# Patient Record
Sex: Male | Born: 2000
Health system: Southern US, Community
[De-identification: ages and names within clinical notes are randomized; demographics above are authoritative.]

## PROBLEM LIST (undated history)

## (undated) DIAGNOSIS — F419 Anxiety disorder, unspecified: Secondary | ICD-10-CM

---
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
## 2018-11-15 ENCOUNTER — Other Ambulatory Visit: Payer: Self-pay

## 2018-11-15 ENCOUNTER — Emergency Department (HOSPITAL_BASED_OUTPATIENT_CLINIC_OR_DEPARTMENT_OTHER): Payer: BLUE CROSS/BLUE SHIELD

## 2018-11-15 ENCOUNTER — Emergency Department (HOSPITAL_BASED_OUTPATIENT_CLINIC_OR_DEPARTMENT_OTHER)
Admission: EM | Admit: 2018-11-15 | Discharge: 2018-11-15 | Disposition: A | Discharge status: Home / Self Care | Payer: BLUE CROSS/BLUE SHIELD | Attending: Emergency Medicine | Admitting: Emergency Medicine

## 2018-11-15 ENCOUNTER — Encounter (HOSPITAL_BASED_OUTPATIENT_CLINIC_OR_DEPARTMENT_OTHER): Payer: Self-pay | Admitting: Emergency Medicine

## 2018-11-15 DIAGNOSIS — R079 Chest pain, unspecified: Secondary | ICD-10-CM | POA: Diagnosis present

## 2018-11-15 DIAGNOSIS — Z87891 Personal history of nicotine dependence: Secondary | ICD-10-CM | POA: Diagnosis not present

## 2018-11-15 DIAGNOSIS — F419 Anxiety disorder, unspecified: Secondary | ICD-10-CM

## 2018-11-15 DIAGNOSIS — R0789 Other chest pain: Secondary | ICD-10-CM | POA: Diagnosis not present

## 2018-11-15 DIAGNOSIS — R200 Anesthesia of skin: Secondary | ICD-10-CM | POA: Diagnosis not present

## 2018-11-15 LAB — CBC WITH DIFFERENTIAL/PLATELET
Abs Immature Granulocytes: 0.02 10*3/uL (ref 0.00–0.07)
Basophils Absolute: 0 10*3/uL (ref 0.0–0.1)
Basophils Relative: 1 %
Eosinophils Absolute: 0 10*3/uL (ref 0.0–0.5)
Eosinophils Relative: 1 %
HCT: 49.8 % (ref 39.0–52.0)
Hemoglobin: 16.2 g/dL (ref 13.0–17.0)
Immature Granulocytes: 0 %
Lymphocytes Relative: 36 %
Lymphs Abs: 2.3 10*3/uL (ref 0.7–4.0)
MCH: 29.2 pg (ref 26.0–34.0)
MCHC: 32.5 g/dL (ref 30.0–36.0)
MCV: 89.7 fL (ref 80.0–100.0)
MONOS PCT: 8 %
Monocytes Absolute: 0.5 10*3/uL (ref 0.1–1.0)
NEUTROS PCT: 54 %
Neutro Abs: 3.5 10*3/uL (ref 1.7–7.7)
Platelets: 265 10*3/uL (ref 150–400)
RBC: 5.55 MIL/uL (ref 4.22–5.81)
RDW: 11.5 % (ref 11.5–15.5)
WBC: 6.4 10*3/uL (ref 4.0–10.5)
nRBC: 0 % (ref 0.0–0.2)

## 2018-11-15 LAB — COMPREHENSIVE METABOLIC PANEL
ALT: 24 U/L (ref 0–44)
AST: 20 U/L (ref 15–41)
Albumin: 4.7 g/dL (ref 3.5–5.0)
Alkaline Phosphatase: 55 U/L (ref 38–126)
Anion gap: 7 (ref 5–15)
BUN: 11 mg/dL (ref 6–20)
CO2: 26 mmol/L (ref 22–32)
Calcium: 9.3 mg/dL (ref 8.9–10.3)
Chloride: 104 mmol/L (ref 98–111)
Creatinine, Ser: 0.72 mg/dL (ref 0.61–1.24)
GFR calc Af Amer: >60 mL/min (ref >60)
Glucose, Bld: 90 mg/dL (ref 70–99)
Potassium: 3.5 mmol/L (ref 3.5–5.1)
Sodium: 137 mmol/L (ref 135–145)
Total Bilirubin: 0.7 mg/dL (ref 0.3–1.2)
Total Protein: 7.9 g/dL (ref 6.5–8.1)

## 2018-11-15 LAB — CBC
HCT: 49.2 % (ref 39.0–52.0)
Hemoglobin: 16.2 g/dL (ref 13.0–17.0)
MCH: 29.5 pg (ref 26.0–34.0)
MCHC: 32.9 g/dL (ref 30.0–36.0)
MCV: 89.6 fL (ref 80.0–100.0)
Platelets: 260 10*3/uL (ref 150–400)
RBC: 5.49 MIL/uL (ref 4.22–5.81)
RDW: 11.4 % — ABNORMAL LOW (ref 11.5–15.5)
WBC: 6.7 10*3/uL (ref 4.0–10.5)
nRBC: 0 % (ref 0.0–0.2)

## 2018-11-15 LAB — LIPASE, BLOOD: Lipase: 31 U/L (ref 11–51)

## 2018-11-15 LAB — TROPONIN I: Troponin I: <0.03 ng/mL (ref <0.03)

## 2018-11-15 MED ORDER — HYDROXYZINE HCL 25 MG PO TABS: 25.0000 mg | ORAL_TABLET | Freq: Three times a day (TID) | ORAL | 0 refills | Status: AC | PRN

## 2018-11-15 MED FILL — hydrOXYzine HCL 25 MG TABS: 25 | 6 days supply | Qty: 20 | Fill #0

## 2018-11-15 NOTE — ED Notes (Signed)
ED Provider at bedside. 

## 2018-11-15 NOTE — ED Provider Notes (Signed)
[facility] EMERGENCY DEPARTMENT Provider Note   CSN: [id number] Arrival date & time: 11/15/18  1052     History   Chief Complaint Chief Complaint  Patient presents with  . Chest Pain    HPI Travis Reilly is a 18 y.o. male here presenting with chest pain, anxiety, left arm numbness.  Patient states that he has intermittent left arm numbness for the last month or so.  Also has some chest pain as well.  Patient went to see [facility] about a month ago and had normal neuro exam as well as normal labs.  No CT head was performed at that time.  For the last 2 weeks, patient has been more stressed out.  He has intermittent left-sided chest pain and he notices numbness is worse.  Denies any weakness or trouble walking.   The history is provided by the patient.    History reviewed. No pertinent past medical history.  There are no active problems to display for this patient.   History reviewed. No pertinent surgical history.      Home Medications    Prior to Admission medications   Medication Sig Start Date End Date Taking? Authorizing Provider  hydrOXYzine (ATARAX/VISTARIL) 25 MG tablet Take 25 mg by mouth 3 (three) times daily as needed.   Yes [provider]    Family History History reviewed. No pertinent family history.  Social History Social History   Tobacco Use  . Smoking status: Former Games developermoker  . Smokeless tobacco: Never Used  Substance Use Topics  . Alcohol use: Never    Frequency: Never  . Drug use: Never     Allergies   Penicillins   Review of Systems Review of Systems  Cardiovascular: Positive for chest pain.  Neurological: Positive for numbness.  All other systems reviewed and are negative.    Physical Exam Updated Vital Signs BP 128/79 (BP Location: Right Arm)   Pulse 66   Temp 98.3 F (36.8 C) (Oral)   Resp 12   Ht 6\' 1"  (1.854 m)   Wt 122.9 kg   SpO2 100%   BMI 35.75 kg/m   Physical Exam Vitals signs and nursing  note reviewed.  Constitutional:      Appearance: He is well-developed.  HENT:     Head: Normocephalic.  Eyes:     Extraocular Movements: Extraocular movements intact.     Pupils: Pupils are equal, round, and reactive to light.  Neck:     Musculoskeletal: Normal range of motion and neck supple.  Cardiovascular:     Rate and Rhythm: Normal rate and regular rhythm.     Heart sounds: Normal heart sounds.  Pulmonary:     Effort: Pulmonary effort is normal.     Breath sounds: Normal breath sounds.  Abdominal:     General: Bowel sounds are normal.     Palpations: Abdomen is soft.  Musculoskeletal: Normal range of motion.  Skin:    General: Skin is warm.     Capillary Refill: Capillary refill takes less than 2 seconds.  Neurological:     General: No focal deficit present.     Mental Status: He is alert and oriented to person, place, and time.     Cranial Nerves: No cranial nerve deficit.     Motor: No weakness.     Comments: CN 2- 12 intact, nl finger to nose, nl sensation and strength throughout, nl gait   Psychiatric:        Mood and  Affect: Mood normal.        Behavior: Behavior normal.      ED Treatments / Results  Labs (all labs ordered are listed, but only abnormal results are displayed) Labs Reviewed  CBC - Abnormal; Notable for the following components:      Result Value   RDW 11.4 (*)    All other components within normal limits  TROPONIN I  CBC WITH DIFFERENTIAL/PLATELET  COMPREHENSIVE METABOLIC PANEL  LIPASE, BLOOD    EKG None  Radiology Dg Chest 2 View  Result Date: 11/15/2018 CLINICAL DATA:  Left-sided chest pain. EXAM: CHEST - 2 VIEW COMPARISON:  No prior. FINDINGS: Mediastinum and hilar structures normal. Lungs are clear. No pleural effusion or pneumothorax. Degenerative change thoracic spine. IMPRESSION: Acute cardiopulmonary disease. Electronically Signed   By: Maisie Fus  Register   On: 11/15/2018 11:34   Ct Head Wo Contrast  Result Date:  11/15/2018 CLINICAL DATA:  18 year old male with history of intermittent left-sided chest pain for the past 2 weeks that began while having an anxiety attack. EXAM: CT HEAD WITHOUT CONTRAST TECHNIQUE: Contiguous axial images were obtained from the base of the skull through the vertex without intravenous contrast. COMPARISON:  None. FINDINGS: Brain: No evidence of acute infarction, hemorrhage, hydrocephalus, extra-axial collection or mass lesion/mass effect. Vascular: No hyperdense vessel or unexpected calcification. Skull: Normal. Negative for fracture or focal lesion. Sinuses/Orbits: No acute finding. Other: None. IMPRESSION: 1. No acute intracranial abnormalities. The appearance of the brain is normal. Electronically Signed   By: Trudie Reed M.D.   On: 11/15/2018 12:34    Procedures Procedures (including critical care time)  Medications Ordered in ED Medications - No data to display   Initial Impression / Assessment and Plan / ED Course  I have reviewed the triage vital signs and the nursing notes.  Pertinent labs & imaging results that were available during my care of the patient were reviewed by me and considered in my medical decision making (see chart for details).    Travis Reilly is a 18 y.o. male here with chest pain, L sided numbness. No numbness or weakness on my exam. Chest pain for 2 weeks and low risk for ACS. I doubt dissection or PE. Will get labs, CT head to r/o mass, trop x 1.   1:14 PM Labs unremarkable. CT head unremarkable. Stable for discharge. Likely panic attacks or anxiety.     Final Clinical Impressions(s) / ED Diagnoses   Final diagnoses:  None    ED Discharge Orders    None       Charlynne Pander, MD 11/15/18 1314

## 2018-11-15 NOTE — Discharge Instructions (Signed)
See your doctor as scheduled in 2 weeks   Take hydroxyzine as needed for anxiety  See your doctor. Ask your doctor about long term medicine for anxiety   Return to ER if you have worse chest pain, numbness, weakness

## 2018-11-15 NOTE — ED Triage Notes (Addendum)
Reports intermittent chest pain x 2 weeks.  Denies shortness of breath.  States recent history of anxiety.  Reports this worsens when eating.

## 2021-04-14 ENCOUNTER — Emergency Department (HOSPITAL_BASED_OUTPATIENT_CLINIC_OR_DEPARTMENT_OTHER)
Admission: EM | Admit: 2021-04-14 | Discharge: 2021-04-14 | Disposition: A | Discharge status: Home / Self Care | Payer: BC Managed Care – PPO | Attending: Emergency Medicine | Admitting: Emergency Medicine

## 2021-04-14 ENCOUNTER — Other Ambulatory Visit: Payer: Self-pay

## 2021-04-14 ENCOUNTER — Emergency Department (HOSPITAL_BASED_OUTPATIENT_CLINIC_OR_DEPARTMENT_OTHER): Payer: BC Managed Care – PPO

## 2021-04-14 ENCOUNTER — Encounter (HOSPITAL_BASED_OUTPATIENT_CLINIC_OR_DEPARTMENT_OTHER): Payer: Self-pay | Admitting: Emergency Medicine

## 2021-04-14 DIAGNOSIS — S93401A Sprain of unspecified ligament of right ankle, initial encounter: Secondary | ICD-10-CM

## 2021-04-14 DIAGNOSIS — F17210 Nicotine dependence, cigarettes, uncomplicated: Secondary | ICD-10-CM | POA: Insufficient documentation

## 2021-04-14 DIAGNOSIS — W500XXA Accidental hit or strike by another person, initial encounter: Secondary | ICD-10-CM | POA: Insufficient documentation

## 2021-04-14 DIAGNOSIS — Y9375 Activity, martial arts: Secondary | ICD-10-CM | POA: Insufficient documentation

## 2021-04-14 DIAGNOSIS — S99911A Unspecified injury of right ankle, initial encounter: Secondary | ICD-10-CM | POA: Diagnosis present
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
## 2021-04-30 ENCOUNTER — Encounter: Payer: Self-pay | Admitting: Family Medicine

## 2021-04-30 ENCOUNTER — Other Ambulatory Visit: Payer: Self-pay

## 2021-04-30 ENCOUNTER — Ambulatory Visit (INDEPENDENT_AMBULATORY_CARE_PROVIDER_SITE_OTHER): Payer: BC Managed Care – PPO | Admitting: Family Medicine

## 2021-04-30 ENCOUNTER — Ambulatory Visit: Payer: Self-pay

## 2021-04-30 ENCOUNTER — Ambulatory Visit (HOSPITAL_BASED_OUTPATIENT_CLINIC_OR_DEPARTMENT_OTHER)
Admission: RE | Admit: 2021-04-30 | Discharge: 2021-04-30 | Disposition: A | Discharge status: Home / Self Care | Payer: BC Managed Care – PPO | Source: Ambulatory Visit | Attending: Family Medicine | Admitting: Family Medicine

## 2021-04-30 VITALS — BP 120/74 | Ht 73.0 in | Wt 243.0 lb

## 2021-04-30 DIAGNOSIS — M6289 Other specified disorders of muscle: Secondary | ICD-10-CM | POA: Diagnosis not present

## 2021-04-30 DIAGNOSIS — G8929 Other chronic pain: Secondary | ICD-10-CM | POA: Diagnosis not present

## 2021-04-30 DIAGNOSIS — M5441 Lumbago with sciatica, right side: Secondary | ICD-10-CM

## 2021-04-30 DIAGNOSIS — M25571 Pain in right ankle and joints of right foot: Secondary | ICD-10-CM | POA: Diagnosis not present

## 2021-04-30 DIAGNOSIS — M5442 Lumbago with sciatica, left side: Secondary | ICD-10-CM | POA: Insufficient documentation

## 2021-04-30 NOTE — Patient Instructions (Signed)
Nice to meet you  Please try the exercises  Please consider the compression for the ankle  I will call with the results.  Please send me a message in MyChart with any questions or updates.  Please see me back in 4 weeks.   --Dr. Jordan Likes
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
## 2021-04-30 NOTE — Assessment & Plan Note (Signed)
Symptoms appear most consistent with a healing herniation of the fascia. No significant defect. Appears to have a healing hematoma.  - counseled on home exercise therapy and supportive care - counseled on compression.
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
## 2021-05-01 ENCOUNTER — Telehealth: Payer: Self-pay | Admitting: Family Medicine

## 2021-05-01 NOTE — Telephone Encounter (Signed)
Unable to leave VM for patient. If he calls back please have him speak with a nurse/CMA and inform that his xray is normal. We can try physical therapy going forward.   If any questions then please take the best time and phone number to call and I will try to call him back.   Myra Rude, MD [facility] 05/01/2021, 2:09 PM

## 2021-05-02 NOTE — Telephone Encounter (Signed)
Pt informed of below.  

## 2021-05-28 ENCOUNTER — Ambulatory Visit: Payer: BC Managed Care – PPO | Admitting: Family Medicine

## 2021-05-28 NOTE — Progress Notes (Deleted)
  Travis Reilly - 20 y.o. male MRN [id number]  Date of birth: 23-Apr-2001  SUBJECTIVE:  Including CC & ROS.  No chief complaint on file.   Travis Reilly is a 20 y.o. male that is  ***.  ***   Review of Systems See HPI   HISTORY: Past Medical, Surgical, Social, and Family History Reviewed & Updated per EMR.   Pertinent Historical Findings include:  No past medical history on file.  No past surgical history on file.  No family history on file.  Social History   Socioeconomic History   Marital status: Single    Spouse name: Not on file   Number of children: Not on file   Years of education: Not on file   Highest education level: Not on file  Occupational History   Not on file  Tobacco Use   Smoking status: Every Day    Types: Cigarettes   Smokeless tobacco: Never  Vaping Use   Vaping Use: Every day  Substance and Sexual Activity   Alcohol use: Yes    Comment: occ   Drug use: Yes    Types: Marijuana   Sexual activity: Not on file  Other Topics Concern   Not on file  Social History Narrative   Not on file   Social Determinants of Health   Financial Resource Strain: Not on file  Food Insecurity: Not on file  Transportation Needs: Not on file  Physical Activity: Not on file  Stress: Not on file  Social Connections: Not on file  Intimate Partner Violence: Not on file     PHYSICAL EXAM:  VS: There were no vitals taken for this visit. Physical Exam Gen: NAD, alert, cooperative with exam, well-appearing MSK:  ***      ASSESSMENT & PLAN:   No problem-specific Assessment & Plan notes found for this encounter.

## 2021-11-08 ENCOUNTER — Encounter (HOSPITAL_BASED_OUTPATIENT_CLINIC_OR_DEPARTMENT_OTHER): Payer: Self-pay | Admitting: *Deleted

## 2021-11-08 ENCOUNTER — Other Ambulatory Visit: Payer: Self-pay

## 2021-11-08 ENCOUNTER — Emergency Department (HOSPITAL_BASED_OUTPATIENT_CLINIC_OR_DEPARTMENT_OTHER)
Admission: EM | Admit: 2021-11-08 | Discharge: 2021-11-09 | Disposition: A | Discharge status: Home / Self Care | Payer: BC Managed Care – PPO | Attending: Emergency Medicine | Admitting: Emergency Medicine

## 2021-11-08 DIAGNOSIS — Z87891 Personal history of nicotine dependence: Secondary | ICD-10-CM | POA: Insufficient documentation

## 2021-11-08 DIAGNOSIS — M5481 Occipital neuralgia: Secondary | ICD-10-CM | POA: Insufficient documentation

## 2021-11-08 DIAGNOSIS — M25512 Pain in left shoulder: Secondary | ICD-10-CM | POA: Diagnosis not present

## 2021-11-08 DIAGNOSIS — R519 Headache, unspecified: Secondary | ICD-10-CM | POA: Diagnosis present

## 2021-11-08 HISTORY — DX: Anxiety disorder, unspecified: F41.9
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
## 2021-11-09 MED ORDER — GABAPENTIN 300 MG PO CAPS: ORAL_CAPSULE | ORAL | 0 refills | Status: AC
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
## 2022-12-04 ENCOUNTER — Encounter (HOSPITAL_BASED_OUTPATIENT_CLINIC_OR_DEPARTMENT_OTHER): Payer: Self-pay

## 2022-12-04 ENCOUNTER — Other Ambulatory Visit: Payer: Self-pay

## 2022-12-04 ENCOUNTER — Emergency Department (HOSPITAL_BASED_OUTPATIENT_CLINIC_OR_DEPARTMENT_OTHER): Payer: BC Managed Care – PPO

## 2022-12-04 DIAGNOSIS — B2790 Infectious mononucleosis, unspecified without complication: Secondary | ICD-10-CM | POA: Diagnosis not present

## 2022-12-04 DIAGNOSIS — Z20822 Contact with and (suspected) exposure to covid-19: Secondary | ICD-10-CM | POA: Diagnosis not present

## 2022-12-04 DIAGNOSIS — R509 Fever, unspecified: Secondary | ICD-10-CM | POA: Diagnosis present

## 2022-12-04 LAB — CBC WITH DIFFERENTIAL/PLATELET
Abs Immature Granulocytes: 0.04 10*3/uL (ref 0.00–0.07)
Basophils Absolute: 0.2 10*3/uL — ABNORMAL HIGH (ref 0.0–0.1)
Basophils Relative: 2 %
Eosinophils Absolute: 0 10*3/uL (ref 0.0–0.5)
Eosinophils Relative: 0 %
HCT: 45.4 % (ref 39.0–52.0)
Hemoglobin: 15.8 g/dL (ref 13.0–17.0)
Immature Granulocytes: 0 %
Lymphocytes Relative: 75 %
Lymphs Abs: 8 10*3/uL — ABNORMAL HIGH (ref 0.7–4.0)
MCH: 31.1 pg (ref 26.0–34.0)
MCHC: 34.8 g/dL (ref 30.0–36.0)
MCV: 89.4 fL (ref 80.0–100.0)
Monocytes Absolute: 0.5 10*3/uL (ref 0.1–1.0)
Monocytes Relative: 5 %
Neutro Abs: 1.9 10*3/uL (ref 1.7–7.7)
Neutrophils Relative %: 18 %
Platelets: 143 10*3/uL — ABNORMAL LOW (ref 150–400)
RBC: 5.08 MIL/uL (ref 4.22–5.81)
RDW: 12 % (ref 11.5–15.5)
Smear Review: NORMAL
WBC: 10.7 10*3/uL — ABNORMAL HIGH (ref 4.0–10.5)
nRBC: 0 % (ref 0.0–0.2)

## 2022-12-04 LAB — COMPREHENSIVE METABOLIC PANEL
ALT: 372 U/L — ABNORMAL HIGH (ref 0–44)
AST: 143 U/L — ABNORMAL HIGH (ref 15–41)
Albumin: 4 g/dL (ref 3.5–5.0)
Alkaline Phosphatase: 319 U/L — ABNORMAL HIGH (ref 38–126)
Anion gap: 7 (ref 5–15)
BUN: 9 mg/dL (ref 6–20)
CO2: 24 mmol/L (ref 22–32)
Calcium: 8.6 mg/dL — ABNORMAL LOW (ref 8.9–10.3)
Chloride: 101 mmol/L (ref 98–111)
Creatinine, Ser: 0.87 mg/dL (ref 0.61–1.24)
GFR, Estimated: >60 mL/min (ref >60)
Glucose, Bld: 98 mg/dL (ref 70–99)
Potassium: 3.6 mmol/L (ref 3.5–5.1)
Sodium: 132 mmol/L — ABNORMAL LOW (ref 135–145)
Total Bilirubin: 3.5 mg/dL — ABNORMAL HIGH (ref 0.3–1.2)
Total Protein: 7.7 g/dL (ref 6.5–8.1)

## 2022-12-04 LAB — URINALYSIS, ROUTINE W REFLEX MICROSCOPIC
Bilirubin Urine: NEGATIVE
Glucose, UA: NEGATIVE mg/dL
Hgb urine dipstick: NEGATIVE
Ketones, ur: NEGATIVE mg/dL
Leukocytes,Ua: NEGATIVE
Nitrite: NEGATIVE
Protein, ur: NEGATIVE mg/dL
Specific Gravity, Urine: 1.01 (ref 1.005–1.030)
pH: 7 (ref 5.0–8.0)

## 2022-12-04 LAB — RESP PANEL BY RT-PCR (RSV, FLU A&B, COVID)  RVPGX2
Influenza A by PCR: NEGATIVE
Influenza B by PCR: NEGATIVE
Resp Syncytial Virus by PCR: NEGATIVE
SARS Coronavirus 2 by RT PCR: NEGATIVE

## 2022-12-04 NOTE — ED Triage Notes (Signed)
Pt states he "got fed up w a situation that's been going on for too long." States he was "dealing w respiratory illness & 'just took some abx I had for that, it got a little better.'"  Woke up w HA 4 days straight, quit smoking cigars & it "kinda" resolved, "weird feeling shooting through body," lymph node on L side of neck swollen, urinating "[employer]-colored urine." States all symptoms are worse at night. Endorses Poor PO

## 2022-12-05 ENCOUNTER — Encounter (HOSPITAL_BASED_OUTPATIENT_CLINIC_OR_DEPARTMENT_OTHER): Payer: Self-pay

## 2022-12-05 ENCOUNTER — Emergency Department (HOSPITAL_BASED_OUTPATIENT_CLINIC_OR_DEPARTMENT_OTHER)
Admission: EM | Admit: 2022-12-05 | Discharge: 2022-12-05 | Disposition: A | Discharge status: Home / Self Care | Payer: BC Managed Care – PPO | Attending: Emergency Medicine | Admitting: Emergency Medicine

## 2022-12-05 ENCOUNTER — Emergency Department (HOSPITAL_BASED_OUTPATIENT_CLINIC_OR_DEPARTMENT_OTHER): Payer: BC Managed Care – PPO

## 2022-12-05 DIAGNOSIS — B2790 Infectious mononucleosis, unspecified without complication: Secondary | ICD-10-CM

## 2022-12-05 LAB — MONONUCLEOSIS SCREEN: Mono Screen: POSITIVE — AB

## 2022-12-05 LAB — HEPATITIS PANEL, ACUTE
HCV Ab: NONREACTIVE
Hep A IgM: NONREACTIVE
Hep B C IgM: NONREACTIVE
Hepatitis B Surface Ag: NONREACTIVE

## 2022-12-05 LAB — LIPASE, BLOOD: Lipase: 35 U/L (ref 11–51)

## 2022-12-05 LAB — GROUP A STREP BY PCR: Group A Strep by PCR: NOT DETECTED

## 2022-12-05 MED ORDER — IOHEXOL 300 MG/ML  SOLN
100.0000 mL | Freq: Once | INTRAMUSCULAR | Status: AC | PRN
  Administered 2022-12-05: 100 mL via INTRAVENOUS

## 2022-12-05 NOTE — ED Notes (Signed)
Pt ambulatory with steady gait to room from lobby.

## 2022-12-05 NOTE — ED Provider Notes (Signed)
[facility]  Provider Note  CSN: [id number] Arrival date & time: 12/04/22 2154  History Chief Complaint  Patient presents with   multiple complaints    Travis Reilly is a 22 y.o. male with no significant PMH reports about 2 weeks ago he had a URI with sore throat, fever and body aches. Since then he has had resolution of his sore throat, but now having continued general malaise, generalized abdominal discomfort, nausea, vomiting and dark colored urine. He noted an enlarged lymph node on left neck. No further fevers. No diarrhea, but grandfather with the patient has had a recent recurrent diarrheal illness.    Home Medications Prior to Admission medications   Medication Sig Start Date End Date Taking? Authorizing Provider  gabapentin (NEURONTIN) 300 MG capsule Take 1 tablet the first day, take 1 tablet twice daily the second day, then take 1 tablet 3 times a day. 11/09/21   Molpus, John, MD  hydrOXYzine (ATARAX/VISTARIL) 25 MG tablet Take 1 tablet (25 mg total) by mouth 3 (three) times daily as needed. 11/15/18   Drenda Freeze, MD     Allergies    Penicillins   Review of Systems   Review of Systems Please see HPI for pertinent positives and negatives  Physical Exam BP 118/82 (BP Location: Right Arm)   Pulse 95   Temp 98.5 F (36.9 C) (Oral)   Resp 17   SpO2 99%   Physical Exam Vitals and nursing note reviewed.  Constitutional:      Appearance: Normal appearance.  HENT:     Head: Normocephalic and atraumatic.     Nose: Nose normal.     Mouth/Throat:     Mouth: Mucous membranes are moist.     Pharynx: No oropharyngeal exudate or posterior oropharyngeal erythema.  Eyes:     Extraocular Movements: Extraocular movements intact.     Conjunctiva/sclera: Conjunctivae normal.  Cardiovascular:     Rate and Rhythm: Normal rate.  Pulmonary:     Effort: Pulmonary effort is normal.     Breath sounds: Normal breath sounds.   Abdominal:     General: Abdomen is flat.     Palpations: Abdomen is soft.     Tenderness: There is abdominal tenderness (mild, RUQ). There is no guarding.  Musculoskeletal:        General: No swelling. Normal range of motion.     Cervical back: Neck supple.  Lymphadenopathy:     Cervical: Cervical adenopathy (mildly enlarged L anterior node) present.  Skin:    General: Skin is warm and dry.  Neurological:     General: No focal deficit present.     Mental Status: He is alert.  Psychiatric:        Mood and Affect: Mood normal.     ED Results / Procedures / Treatments   EKG None  Procedures Procedures  Medications Ordered in the ED Medications  iohexol (OMNIPAQUE) 300 MG/ML solution 100 mL (100 mLs Intravenous Contrast Given 12/05/22 0212)    Initial Impression and Plan  Patient here with an unusual constellation of symptoms that seems to have started with a viral illness last week. Labs done in triage show CBC with smudge cells/atypical lymphs likely reactive, does not appear malignant per lab tech. CMP with elevated liver enzymes and bilirubin which may account for his dark urine, but no clear etiology. No recent labs to compare. Will add lipase, hepatitis panel, strep and mono tests. UA is  clear. I personally viewed the images from radiology studies and agree with radiologist interpretation: CXR is normal. Send for CT to evaluate hepatobiliary system.   ED Course   Clinical Course as of 12/05/22 0259  Fri Dec 05, 2022  0241 Lipase is normal.  [CS]  0256 I personally viewed the images from radiology studies and agree with radiologist interpretation: CT shows splenomegaly which is also consistent with his positive mono test. This also explains his abnormal labs and his sorethroat/lymphadenopathy.  Patient advised he may still feel poorly for another week or two. Recommend he rest, stay hydrated and avoid any contact sports/injuries to spleen. PCP follow up to recheck labs. RTED  for any other concerns.  [CS]    Clinical Course User Index [CS] Truddie Hidden, MD     MDM Rules/Calculators/A&P Medical Decision Making Given presenting complaint, I considered that admission might be necessary. After review of results from ED lab and/or imaging studies, admission to the hospital is not indicated at this time.    Problems Addressed: Infectious mononucleosis without complication, infectious mononucleosis due to unspecified organism: acute illness or injury  Amount and/or Complexity of Data Reviewed Labs: ordered. Decision-making details documented in ED Course. Radiology: ordered and independent interpretation performed. Decision-making details documented in ED Course.  Risk Prescription drug management. Decision regarding hospitalization.     Final Clinical Impression(s) / ED Diagnoses Final diagnoses:  Infectious mononucleosis without complication, infectious mononucleosis due to unspecified organism    Rx / DC Orders ED Discharge Orders     None        Truddie Hidden, MD 12/05/22 [phone]

## 2022-12-10 LAB — PATHOLOGIST SMEAR REVIEW: Path Review: REACTIVE

## 2023-02-16 ENCOUNTER — Encounter: Payer: Self-pay | Admitting: *Deleted

## 2023-03-19 IMAGING — DX DG ANKLE COMPLETE 3+V*R*
3 series · 3 of 3 positions shown · non-contrast
Comparison: None.

CLINICAL DATA: Injured right ankle doing martial arts.

EXAM:
RIGHT ANKLE - COMPLETE 3+ VIEW

[ankle ap]
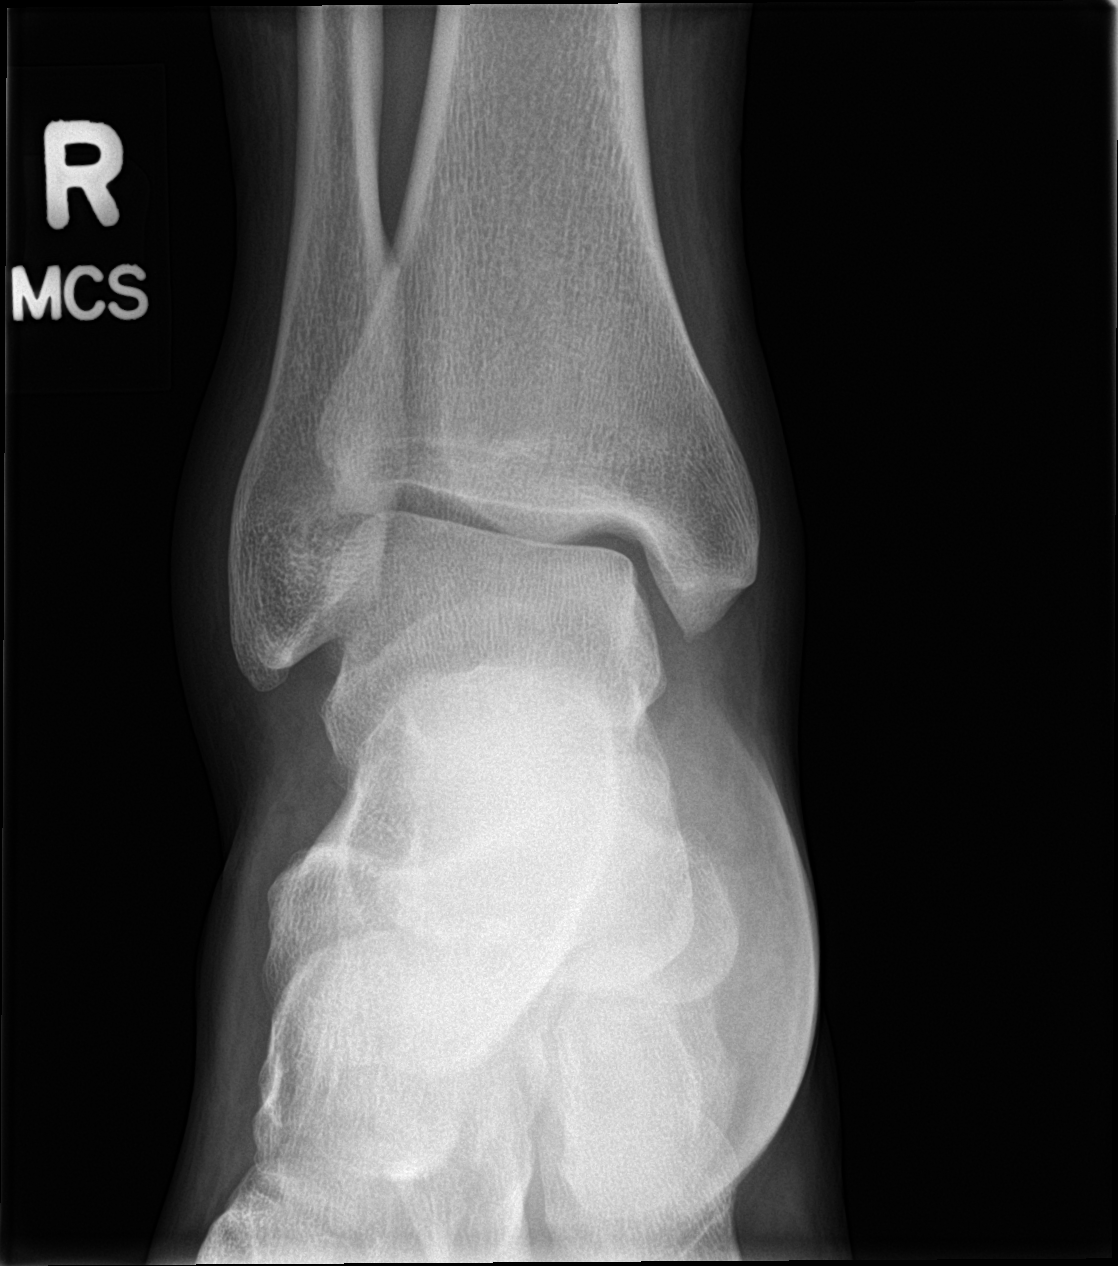

[ankle obl]
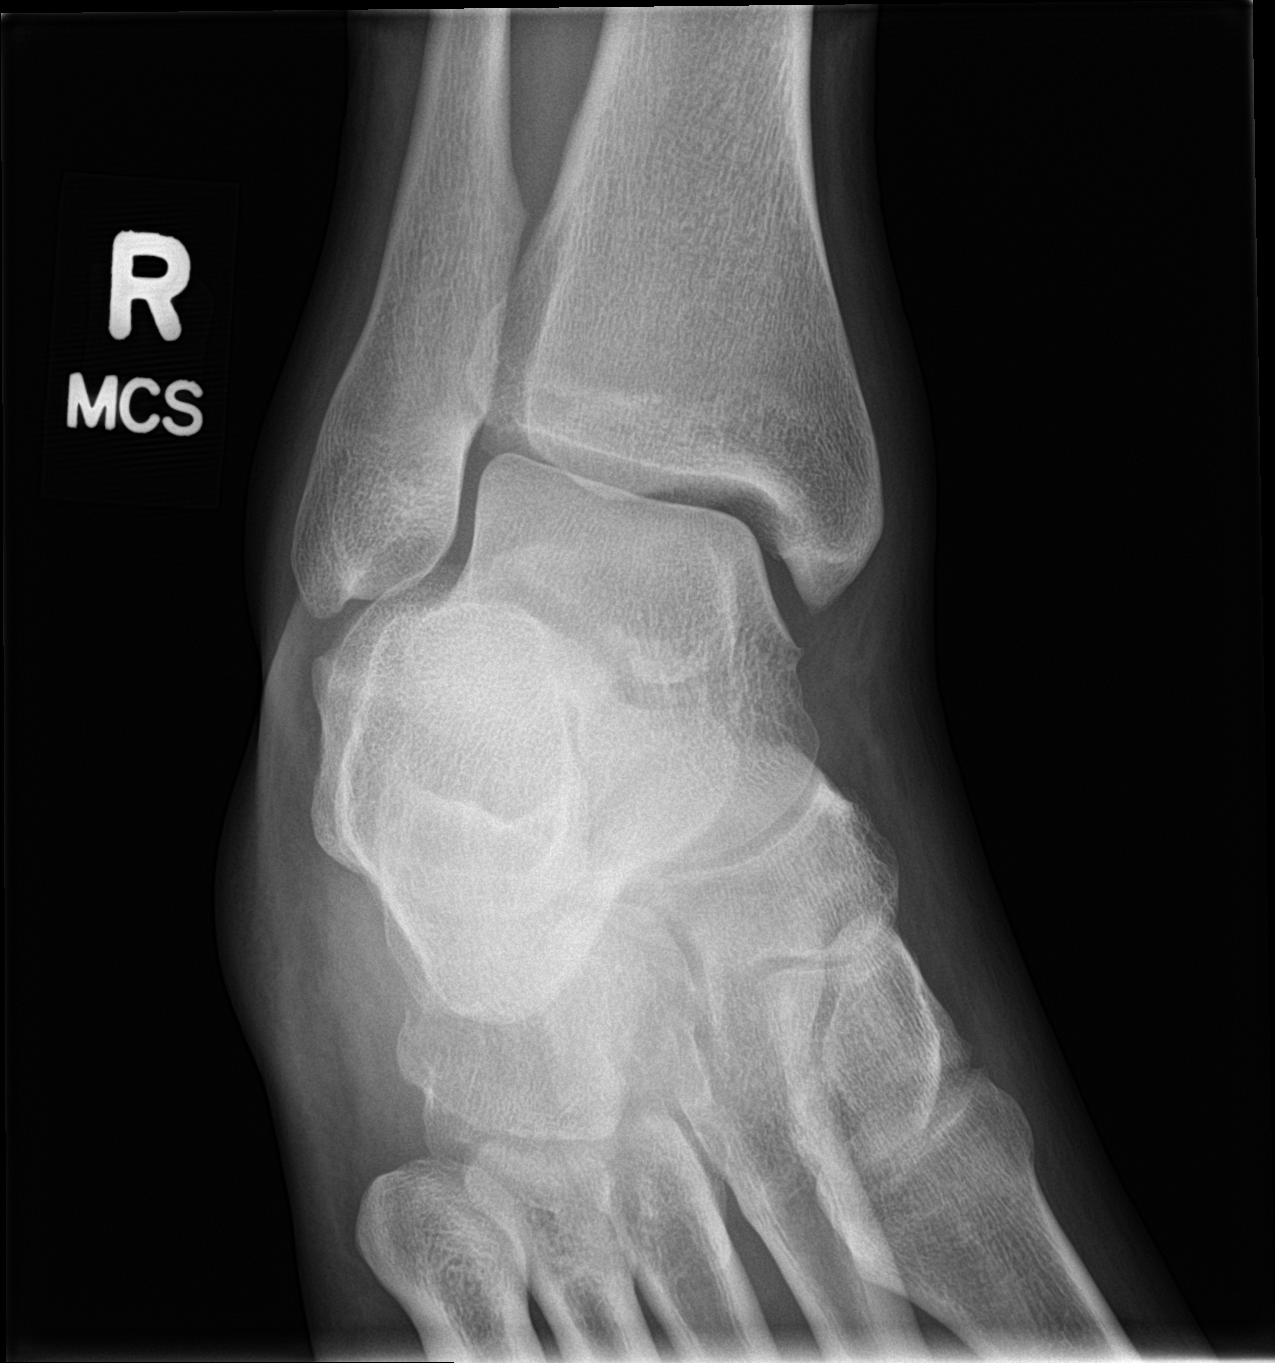

[ankle lat]
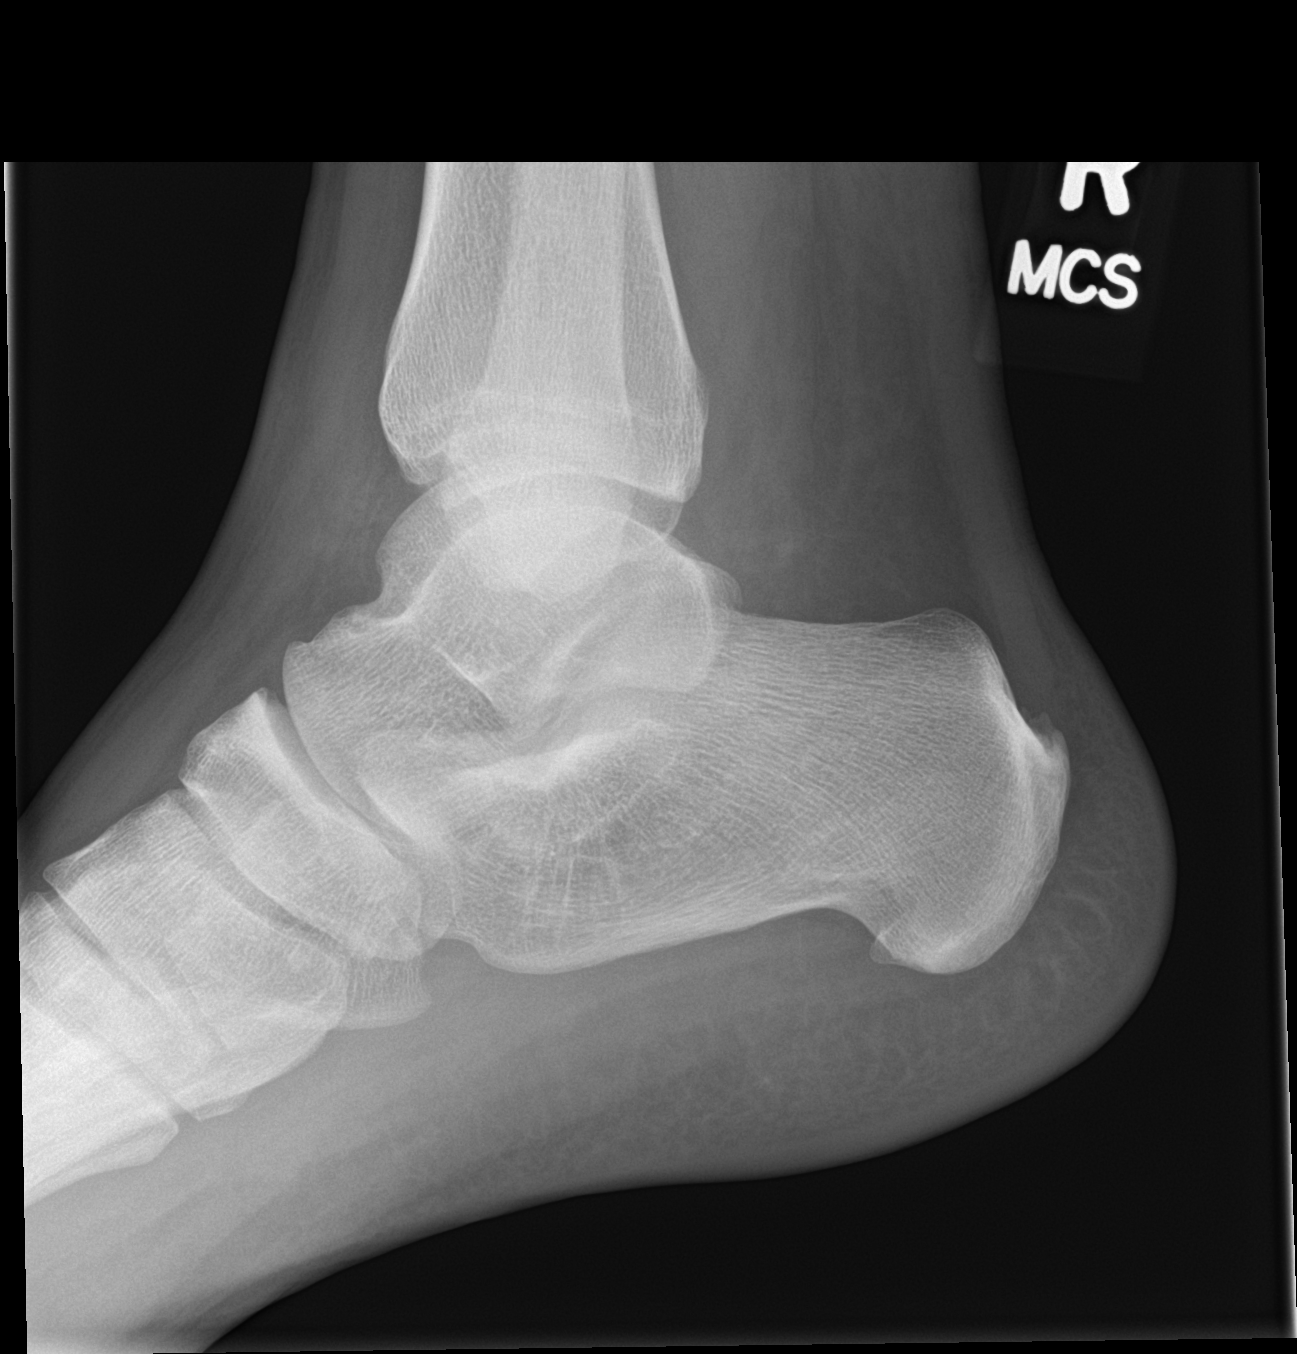

[3 of 3 positions shown; findings below may reference images not displayed]

FINDINGS: The ankle mortise is maintained. No acute ankle fracture is
identified. No ankle joint effusion. No osteochondral lesion. The
mid and hindfoot bony structures are intact.
IMPRESSION: No acute bony findings.
